# Patient Record
Sex: Male | Born: 1995 | Race: White | Hispanic: No | Marital: Single | Smoking: Never smoker
Health system: Southern US, Community
[De-identification: ages and names within clinical notes are randomized; demographics above are authoritative.]

## PROBLEM LIST (undated history)

## (undated) HISTORY — PX: KNEE SURGERY: SHX244

## (undated) HISTORY — PX: TONSILLECTOMY: SUR1361

---
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
## 2017-09-17 ENCOUNTER — Ambulatory Visit (INDEPENDENT_AMBULATORY_CARE_PROVIDER_SITE_OTHER): Payer: BC Managed Care – PPO

## 2017-09-17 ENCOUNTER — Encounter: Payer: Self-pay | Admitting: Emergency Medicine

## 2017-09-17 ENCOUNTER — Other Ambulatory Visit: Payer: Self-pay

## 2017-09-17 ENCOUNTER — Ambulatory Visit
Admission: EM | Admit: 2017-09-17 | Discharge: 2017-09-17 | Disposition: A | Discharge status: Home / Self Care | Payer: BC Managed Care – PPO | Attending: Family Medicine | Admitting: Family Medicine

## 2017-09-17 DIAGNOSIS — W19XXXA Unspecified fall, initial encounter: Secondary | ICD-10-CM

## 2017-09-17 DIAGNOSIS — W16022A Fall into swimming pool striking bottom causing other injury, initial encounter: Secondary | ICD-10-CM | POA: Diagnosis not present

## 2017-09-17 DIAGNOSIS — M546 Pain in thoracic spine: Secondary | ICD-10-CM | POA: Diagnosis not present

## 2017-09-17 DIAGNOSIS — S060X0A Concussion without loss of consciousness, initial encounter: Secondary | ICD-10-CM

## 2017-09-17 DIAGNOSIS — M542 Cervicalgia: Secondary | ICD-10-CM

## 2017-09-17 MED ORDER — CYCLOBENZAPRINE HCL 10 MG PO TABS: 10.0000 mg | ORAL_TABLET | Freq: Every day | ORAL | 0 refills | Status: AC

## 2017-09-17 NOTE — ED Triage Notes (Signed)
Patient states that he fell in the pool and hit his head on the bottom of the pool on Saturday.  Patient c/o pain in the back of his head, neck and upper back.

## 2017-09-17 NOTE — ED Provider Notes (Signed)
[facility]    CSN: [id number] Arrival date & time: 09/17/17  1408     History   Chief Complaint Chief Complaint  Patient presents with  . Fall  . Head Injury    HPI Brandon Kerr is a 22 y.o. male.   22 yo male with a c/o neck and upper back pain for 2 days and intermittent headaches. States 3 days ago he hit his head on the bottom of a pool. Denies loss of consciousness but felt a little "out of it" afterwards. Denies any vision changes, numbness/tingling, one-sided weakness. States now main complaint is pain to neck and upper back.   The history is provided by the patient.  Fall   Head Injury    History reviewed. No pertinent past medical history.  There are no active problems to display for this patient.   Past Surgical History:  Procedure Laterality Date  . KNEE SURGERY Right   . TONSILLECTOMY         Home Medications    Prior to Admission medications   Medication Sig Start Date End Date Taking? Authorizing Provider  cyclobenzaprine (FLEXERIL) 10 MG tablet Take 1 tablet (10 mg total) by mouth at bedtime. 09/17/17   Payton Mccallum, MD    Family History History reviewed. No pertinent family history.  Social History Social History   Tobacco Use  . Smoking status: Never Smoker  . Smokeless tobacco: Current User    Types: Chew  Substance Use Topics  . Alcohol use: Yes  . Drug use: Never     Allergies   Patient has no known allergies.   Review of Systems Review of Systems   Physical Exam Triage Vital Signs ED Triage Vitals  Enc Vitals Group     BP 09/17/17 1439 119/75     Pulse Rate 09/17/17 1439 (!) 103     Resp 09/17/17 1439 16     Temp 09/17/17 1439 98.4 F (36.9 C)     Temp Source 09/17/17 1439 Oral     SpO2 09/17/17 1439 100 %     Weight 09/17/17 1435 230 lb (104.3 kg)     Height 09/17/17 1435 6' (1.829 m)     Head Circumference --      Peak Flow --      Pain Score 09/17/17 1435 6     Pain Loc --      Pain Edu?  --      Excl. in GC? --    No data found.  Updated Vital Signs BP 119/75 (BP Location: Left Arm)   Pulse (!) 103   Temp 98.4 F (36.9 C) (Oral)   Resp 16   Ht 6' (1.829 m)   Wt 230 lb (104.3 kg)   SpO2 100%   BMI 31.19 kg/m   Visual Acuity Right Eye Distance:   Left Eye Distance:   Bilateral Distance:    Right Eye Near:   Left Eye Near:    Bilateral Near:     Physical Exam  Constitutional: He is oriented to person, place, and time. He appears well-developed and well-nourished. No distress.  HENT:  Head: Normocephalic and atraumatic.  Right Ear: Tympanic membrane, external ear and ear canal normal.  Left Ear: Tympanic membrane, external ear and ear canal normal.  Nose: Nose normal.  Mouth/Throat: Uvula is midline, oropharynx is clear and moist and mucous membranes are normal. No oropharyngeal exudate or tonsillar abscesses.  Eyes: Pupils are equal, round, and reactive to  light. Conjunctivae and EOM are normal. Right eye exhibits no discharge. Left eye exhibits no discharge. No scleral icterus.  Neck: Normal range of motion. Neck supple. No tracheal deviation present. No thyromegaly present.  Cardiovascular: Normal rate, regular rhythm and normal heart sounds.  Pulmonary/Chest: Effort normal and breath sounds normal. No stridor. No respiratory distress. He has no wheezes. He has no rales. He exhibits no tenderness.  Musculoskeletal:       Cervical back: He exhibits decreased range of motion, tenderness and spasm. He exhibits no bony tenderness, no swelling, no edema, no deformity, no laceration and normal pulse.       Thoracic back: He exhibits tenderness, bony tenderness and spasm. He exhibits no swelling, no edema, no deformity, no laceration and normal pulse.  Lymphadenopathy:    He has no cervical adenopathy.  Neurological: He is alert and oriented to person, place, and time. He displays normal reflexes. No cranial nerve deficit or sensory deficit. He exhibits normal  muscle tone. Coordination normal.  nonfocal neurologic exam  Skin: Skin is warm and dry. No rash noted. He is not diaphoretic.  Psychiatric: He has a normal mood and affect. His behavior is normal. Judgment and thought content normal.  Nursing note and vitals reviewed.    UC Treatments / Results  Labs (all labs ordered are listed, but only abnormal results are displayed) Labs Reviewed - No data to display  EKG None  Radiology Dg Cervical Spine Complete  Result Date: 09/17/2017 CLINICAL DATA:  Neck pain after fall. EXAM: CERVICAL SPINE - COMPLETE 4+ VIEW COMPARISON:  None. FINDINGS: There is no evidence of cervical spine fracture or prevertebral soft tissue swelling. Alignment is normal. No other significant bone abnormalities are identified. IMPRESSION: Negative cervical spine radiographs. Electronically Signed   By: Lupita RaiderJames  Green Jr, M.D.   On: 09/17/2017 15:55   Dg Thoracic Spine 2 View  Result Date: 09/17/2017 CLINICAL DATA:  Thoracic spine pain since a fall off a deck 09/15/2017. Initial encounter. EXAM: THORACIC SPINE 2 VIEWS COMPARISON:  None. FINDINGS: There is no evidence of thoracic spine fracture. Alignment is normal. No other significant bone abnormalities are identified. IMPRESSION: Negative exam. Electronically Signed   By: Drusilla Kannerhomas  Dalessio M.D.   On: 09/17/2017 15:53    Procedures Procedures (including critical care time)  Medications Ordered in UC Medications - No data to display  Initial Impression / Assessment and Plan / UC Course  I have reviewed the triage vital signs and the nursing notes.  Pertinent labs & imaging results that were available during my care of the patient were reviewed by me and considered in my medical decision making (see chart for details).      Final Clinical Impressions(s) / UC Diagnoses   Final diagnoses:  Fall, initial encounter  Concussion without loss of consciousness, initial encounter  Neck pain  Acute thoracic back pain,  unspecified back pain laterality    ED Prescriptions    Medication Sig Dispense Auth. Provider   cyclobenzaprine (FLEXERIL) 10 MG tablet Take 1 tablet (10 mg total) by mouth at bedtime. 30 tablet Payton Mccallumonty, Samiha Denapoli, MD     1. x-ray results and diagnosis reviewed with patient 2. rx as per orders above; reviewed possible side effects, interactions, risks and benefits  3. Recommend supportive treatment with otc analgesics prn  4. Follow-up prn if symptoms worsen or don't improve  Controlled Substance Prescriptions [place] Controlled Substance Registry consulted? Not Applicable   Payton Mccallumonty, Sameeha Rockefeller, MD 09/17/17 [phone]

## 2017-09-22 ENCOUNTER — Ambulatory Visit
Admission: EM | Admit: 2017-09-22 | Discharge: 2017-09-22 | Disposition: A | Discharge status: Home / Self Care | Payer: BC Managed Care – PPO | Attending: Family Medicine | Admitting: Family Medicine

## 2017-09-22 DIAGNOSIS — S30861A Insect bite (nonvenomous) of abdominal wall, initial encounter: Secondary | ICD-10-CM

## 2017-09-22 DIAGNOSIS — R5383 Other fatigue: Secondary | ICD-10-CM

## 2017-09-22 DIAGNOSIS — R52 Pain, unspecified: Secondary | ICD-10-CM

## 2017-09-22 DIAGNOSIS — W57XXXA Bitten or stung by nonvenomous insect and other nonvenomous arthropods, initial encounter: Secondary | ICD-10-CM

## 2017-09-22 DIAGNOSIS — M791 Myalgia, unspecified site: Secondary | ICD-10-CM

## 2017-09-22 LAB — BASIC METABOLIC PANEL
ANION GAP: 9 (ref 5–15)
BUN: 9 mg/dL (ref 6–20)
CALCIUM: 8.8 mg/dL — AB (ref 8.9–10.3)
CO2: 25 mmol/L (ref 22–32)
Chloride: 103 mmol/L (ref 98–111)
Creatinine, Ser: 1.02 mg/dL (ref 0.61–1.24)
Glucose, Bld: 86 mg/dL (ref 70–99)
POTASSIUM: 4 mmol/L (ref 3.5–5.1)
SODIUM: 137 mmol/L (ref 135–145)

## 2017-09-22 MED ORDER — DOXYCYCLINE HYCLATE 100 MG PO CAPS: 100.0000 mg | ORAL_CAPSULE | Freq: Two times a day (BID) | ORAL | 0 refills | Status: AC

## 2017-09-22 NOTE — Discharge Instructions (Addendum)
Take medication as prescribed. Rest. Drink plenty of fluids.  ° °Follow up with your primary care physician this week. Return to Urgent care for new or worsening concerns.  ° °

## 2017-09-22 NOTE — ED Provider Notes (Signed)
[facility] ____________________________________________  Time seen: Approximately 11:04 AM  I have reviewed the triage vital signs and the nursing notes.   HISTORY  Chief Complaint Tick Removal   HPI Brandon Kerr is a 22 y.o. male presenting with family at bedside for evaluation of overall not feeling well for approximately 2 weeks.  Patient states 2 weeks ago he had a tick attached to his left side that he removed and did not think much about.  Unsure of how long the tick was attached.  States no rash or skin changes from tick bite site.  States however after tick bite he noticed generalized aching pain, feeling more fatigued.  States 2 days this week he had some diarrhea, none today.  No vomiting or associated abdominal pain.  States normal color stool.  Reports has continued to stay active and is continue to work, and reports that he does work outside, but states that it has been normal without change.  Reports continues to eat and drink well.  No recent cough, congestion or sore throat.  Denies other aggravating or alleviating factors.  Denies any other triggers.  States reports otherwise feels well. Denies chest pain, shortness of breath, abdominal pain, paresthesias, or rash. Denies recent sickness. Denies recent antibiotic use.  Denies chronic medical problems.  Care, [facility]: PCP   No past medical history on file.  There are no active problems to display for this patient.   Past Surgical History:  Procedure Laterality Date  . KNEE SURGERY Right   . TONSILLECTOMY       No current facility-administered medications for this encounter.   Current Outpatient Medications:  .  cyclobenzaprine (FLEXERIL) 10 MG tablet, Take 1 tablet (10 mg total) by mouth at bedtime., Disp: 30 tablet, Rfl: 0 .  doxycycline (VIBRAMYCIN) 100 MG capsule, Take 1 capsule (100 mg total) by mouth 2 (two) times daily., Disp: 20 capsule, Rfl: 0  Allergies Patient has no known  allergies.  No family history on file.  Social History Social History   Tobacco Use  . Smoking status: Never Smoker  . Smokeless tobacco: Current User    Types: Chew  Substance Use Topics  . Alcohol use: Yes  . Drug use: Never    Review of Systems Constitutional: No fever Cardiovascular: Denies chest pain. Respiratory: Denies shortness of breath. Gastrointestinal: No abdominal pain.  No nausea, no vomiting. As above.  Genitourinary: Negative for dysuria. Musculoskeletal: Negative for back pain. Skin: Negative for rash.   ____________________________________________   PHYSICAL EXAM:  VITAL SIGNS: ED Triage Vitals  Enc Vitals Group     BP 09/22/17 1003 122/74     Pulse Rate 09/22/17 1003 80     Resp 09/22/17 1003 16     Temp 09/22/17 1003 98.2 F (36.8 C)     Temp Source 09/22/17 1003 Oral     SpO2 09/22/17 1003 100 %     Weight 09/22/17 1001 238 lb (108 kg)     Height 09/22/17 1001 6' (1.829 m)     Head Circumference --      Peak Flow --      Pain Score 09/22/17 1000 5     Pain Loc --      Pain Edu? --      Excl. in GC? --     Constitutional: Alert and oriented. Well appearing and in no acute distress. Eyes: Conjunctivae are normal.  ENT      Head: Normocephalic and atraumatic.  Nose: No congestion/rhinnorhea.      Mouth/Throat: Mucous membranes are moist. Oropharynx non-erythematous. Neck: No stridor. Supple without meningismus.  Hematological/Lymphatic/Immunilogical: No cervical lymphadenopathy. Cardiovascular: Normal rate, regular rhythm. Grossly normal heart sounds.  Good peripheral circulation. Respiratory: Normal respiratory effort without tachypnea nor retractions. Breath sounds are clear and equal bilaterally. No wheezes, rales, rhonchi. Gastrointestinal: Soft and nontender. No CVA tenderness. Musculoskeletal:  No midline cervical, thoracic or lumbar tenderness to palpation.  Neurologic:  Normal speech and language. No gross focal neurologic  deficits are appreciated. Speech is normal. No gait instability.  Skin:  Skin is warm, dry and intact. No rash noted. Psychiatric: Mood and affect are normal. Speech and behavior are normal. Patient exhibits appropriate insight and judgment   ___________________________________________   LABS (all labs ordered are listed, but only abnormal results are displayed)  Labs Reviewed  BASIC METABOLIC PANEL - Abnormal; Notable for the following components:      Result Value   Calcium 8.8 (*)    All other components within normal limits  B. BURGDORFI ANTIBODIES  ROCKY MTN SPOTTED FVR ABS PNL(IGG+IGM)    RADIOLOGY  No results found. ____________________________________________  PROCEDURES Procedures   INITIAL IMPRESSION / ASSESSMENT AND PLAN / ED COURSE  Pertinent labs & imaging results that were available during my care of the patient were reviewed by me and considered in my medical decision making (see chart for details).  Very well-appearing patient.  No acute distress.  Will evaluate BMP as well as send out for tick labs as requested.  PMP reviewed, overall unremarkable discussed with patient mother.  Will send for Lyme and RMSF labs, and will empirically treat with oral doxy.  Discussed strict follow-up and return parameters and recommend for reevaluation and follow-up with primary care in 1 week.Discussed indication, risks and benefits of medications with patient, including photosensitivity.  Discussed follow up with Primary care physician this week. Discussed follow up and return parameters including no resolution or any worsening concerns. Patient verbalized understanding and agreed to plan.   ____________________________________________   FINAL CLINICAL IMPRESSION(S) / ED DIAGNOSES  Final diagnoses:  Tick bite, initial encounter  Other fatigue  Generalized body aches     ED Discharge Orders        Ordered    doxycycline (VIBRAMYCIN) 100 MG capsule  2 times daily      09/22/17 1137       Note: This dictation was prepared with Dragon dictation along with smaller phrase technology. Any transcriptional errors that result from this process are unintentional.         Renford DillsMiller, Kaleia Longhi, NP 09/22/17 1253

## 2017-09-22 NOTE — ED Triage Notes (Addendum)
As per patient had Tick bite 3 weeks ago had some neck, back and shoulder pain seen here and Rx cyclobenzapril but now remember had tick bite 3 weeks ago and had some fever 99.5-100.5 F in past, has some exhaustion. As per patient Has some HA onset 7 days and some diarrhea.

## 2017-09-25 LAB — B. BURGDORFI ANTIBODIES

## 2017-09-26 LAB — ROCKY MTN SPOTTED FVR ABS PNL(IGG+IGM)
RMSF IGG: POSITIVE — AB
RMSF IGM: 0.24 {index} (ref 0.00–0.89)

## 2017-09-26 LAB — RMSF, IGG, IFA: RMSF, IGG, IFA: 1:64 {titer} — ABNORMAL HIGH

## 2017-10-01 ENCOUNTER — Telehealth (HOSPITAL_COMMUNITY): Payer: Self-pay

## 2017-10-01 NOTE — Telephone Encounter (Signed)
Pt called and given test results and instructed of Renford DillsLindsey Miller NP instructions, verbalized understanding.

## 2019-03-24 ENCOUNTER — Ambulatory Visit: Payer: BC Managed Care – PPO | Attending: Internal Medicine

## 2019-03-27 IMAGING — CR DG THORACIC SPINE 2V
2 series · 3 of 3 positions shown · non-contrast
Comparison: None.

CLINICAL DATA: Thoracic spine pain since a fall off a deck
09/15/2017. Initial encounter.

EXAM:
THORACIC SPINE 2 VIEWS

[t-spine ap]
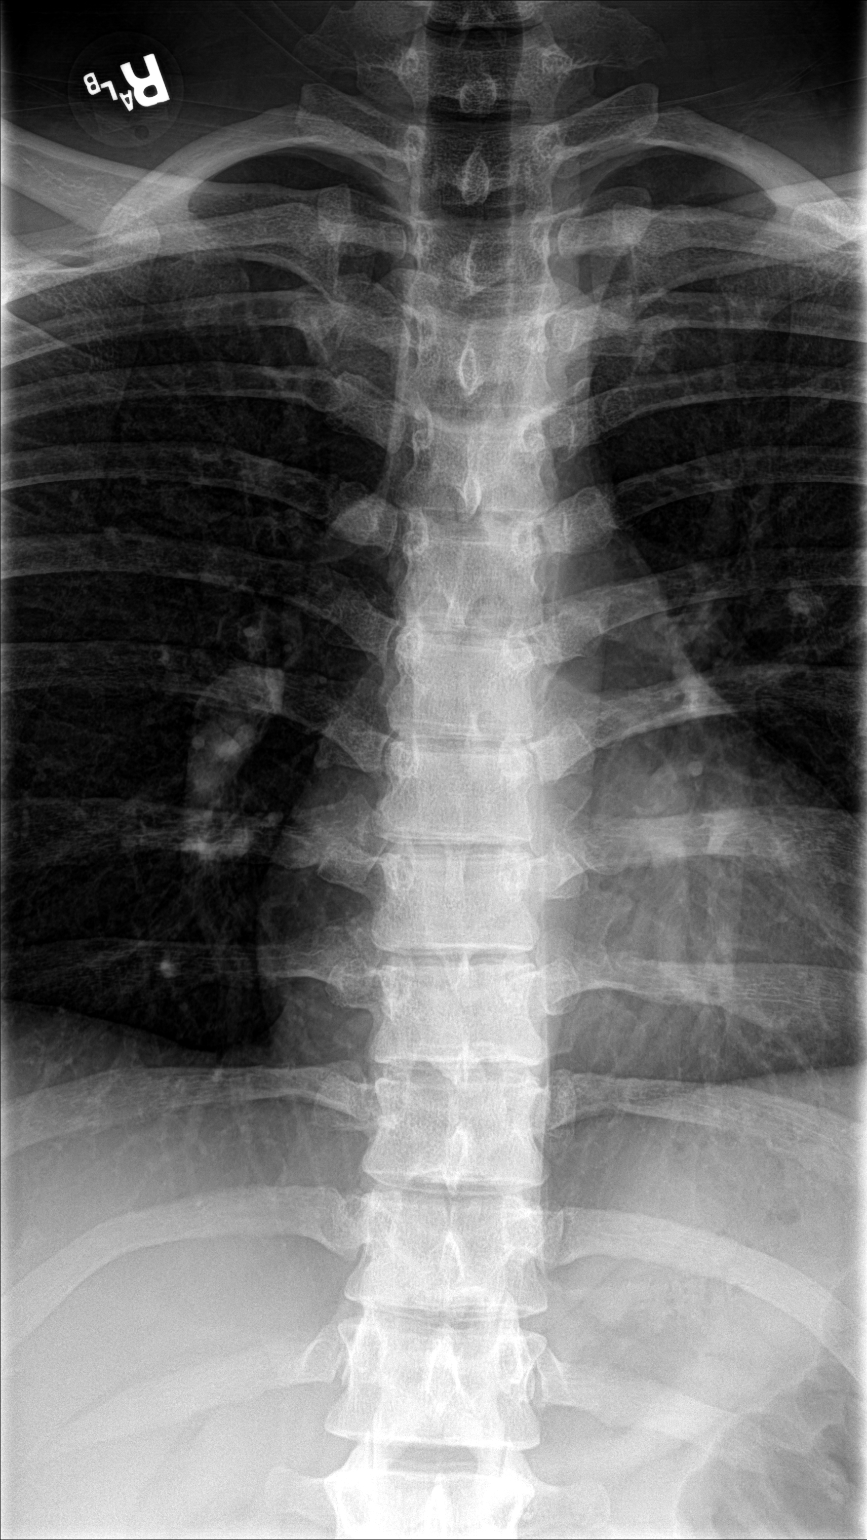

[Series 2: t-spine lat · 0.14mm/px · 2 of 2 slices shown]
[im 1/2]
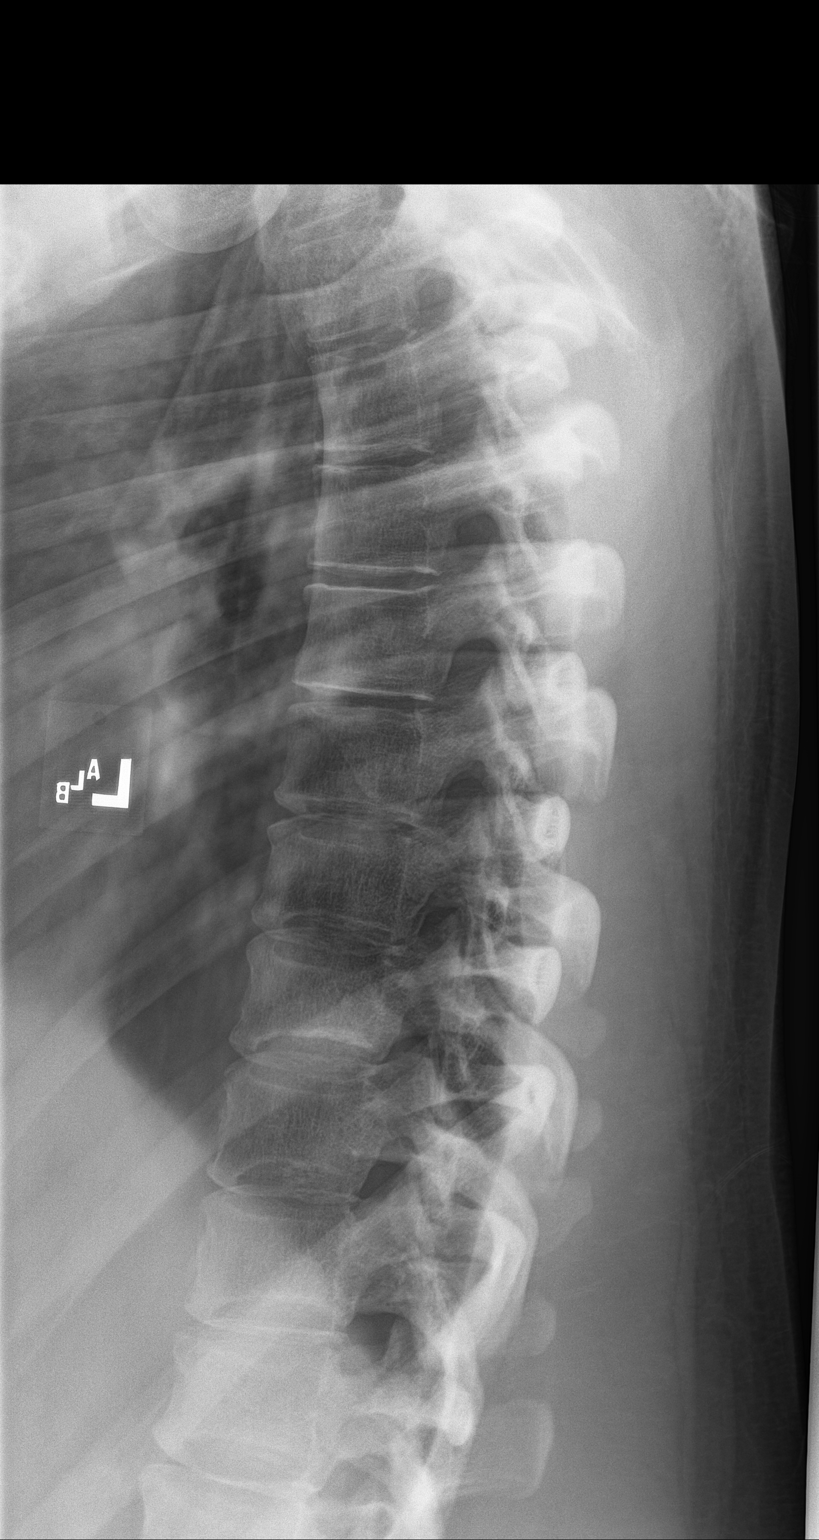
[im 2/2]
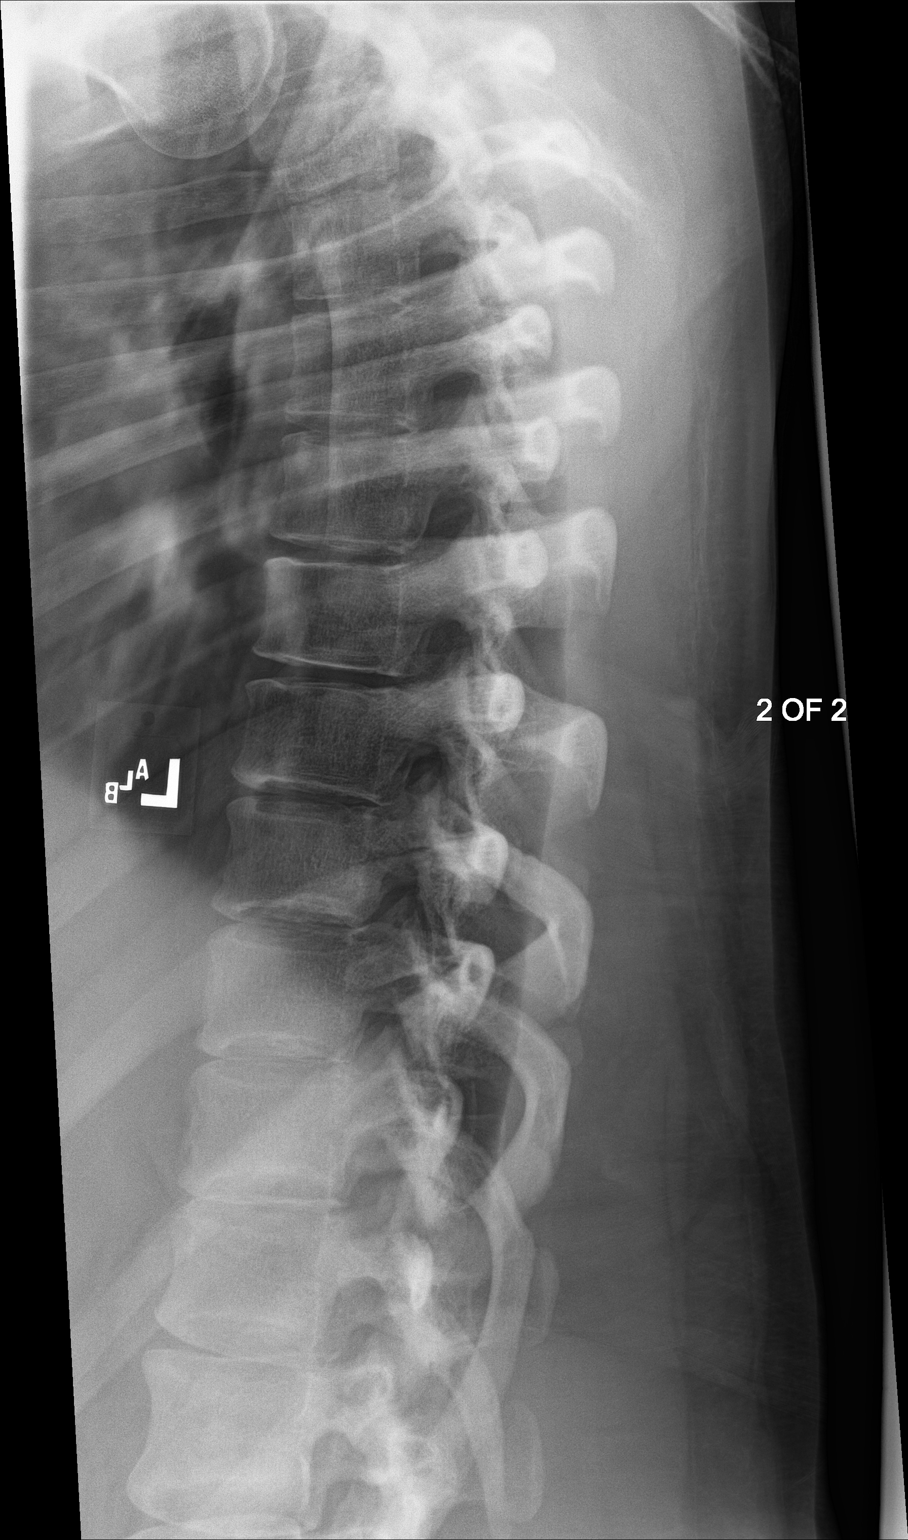

[3 of 3 positions shown; findings below may reference images not displayed]

FINDINGS: There is no evidence of thoracic spine fracture. Alignment is
normal. No other significant bone abnormalities are identified.
IMPRESSION: Negative exam.

## 2019-11-26 ENCOUNTER — Other Ambulatory Visit: Payer: Self-pay

## 2019-11-26 ENCOUNTER — Other Ambulatory Visit: Payer: BC Managed Care – PPO

## 2019-11-26 DIAGNOSIS — Z20822 Contact with and (suspected) exposure to covid-19: Secondary | ICD-10-CM

## 2019-11-28 LAB — NOVEL CORONAVIRUS, NAA: SARS-CoV-2, NAA: NOT DETECTED

## 2019-11-28 LAB — SARS-COV-2, NAA 2 DAY TAT

## 2019-12-01 ENCOUNTER — Other Ambulatory Visit: Payer: BC Managed Care – PPO

## 2019-12-01 ENCOUNTER — Other Ambulatory Visit: Payer: Self-pay

## 2019-12-01 DIAGNOSIS — Z20822 Contact with and (suspected) exposure to covid-19: Secondary | ICD-10-CM

## 2019-12-02 LAB — SARS-COV-2, NAA 2 DAY TAT

## 2019-12-02 LAB — NOVEL CORONAVIRUS, NAA: SARS-CoV-2, NAA: NOT DETECTED

## 2021-02-03 ENCOUNTER — Other Ambulatory Visit: Payer: Self-pay | Admitting: Podiatry

## 2021-02-03 ENCOUNTER — Ambulatory Visit (INDEPENDENT_AMBULATORY_CARE_PROVIDER_SITE_OTHER): Payer: BC Managed Care – PPO

## 2021-02-03 ENCOUNTER — Ambulatory Visit: Payer: BC Managed Care – PPO | Admitting: Podiatry

## 2021-02-03 ENCOUNTER — Other Ambulatory Visit: Payer: Self-pay

## 2021-02-03 DIAGNOSIS — M722 Plantar fascial fibromatosis: Secondary | ICD-10-CM | POA: Diagnosis not present

## 2021-02-04 ENCOUNTER — Encounter: Payer: Self-pay | Admitting: Podiatry

## 2021-02-04 NOTE — Progress Notes (Signed)
  Subjective:  Patient ID: Brandon Kerr, male    DOB: 05/05/1995,  MRN: [id number]  Chief Complaint  Patient presents with   Plantar Fasciitis    Right foot pain in the heel     25 y.o. male presents with the above complaint.  Patient presents with complaint of right heel pain that has been going for 6 to 7 months has progressed to getting worse.  Pain starts in the morning and gets worse.  Feels like his walking needles and the pain.  He has been doing this for quite some time.  He would like to discuss treatment options for this.  He works a lot on his foot.  He does not wear any kind of orthotics.  He wears regular shoes.  He has not seen anyone else prior to seeing me for this.   Review of Systems: Negative except as noted in the HPI. Denies N/V/F/Ch.  No past medical history on file.  Current Outpatient Medications:    cyclobenzaprine (FLEXERIL) 10 MG tablet, Take 1 tablet (10 mg total) by mouth at bedtime., Disp: 30 tablet, Rfl: 0   doxycycline (VIBRAMYCIN) 100 MG capsule, Take 1 capsule (100 mg total) by mouth 2 (two) times daily., Disp: 20 capsule, Rfl: 0  Social History   Tobacco Use  Smoking Status Never  Smokeless Tobacco Current   Types: Chew    No Known Allergies Objective:  There were no vitals filed for this visit. There is no height or weight on file to calculate BMI. Constitutional Well developed. Well nourished.  Vascular Dorsalis pedis pulses palpable bilaterally. Posterior tibial pulses palpable bilaterally. Capillary refill normal to all digits.  No cyanosis or clubbing noted. Pedal hair growth normal.  Neurologic Normal speech. Oriented to person, place, and time. Epicritic sensation to light touch grossly present bilaterally.  Dermatologic Nails well groomed and normal in appearance. No open wounds. No skin lesions.  Orthopedic: Normal joint ROM without pain or crepitus bilaterally. No visible deformities. Tender to palpation at the  calcaneal tuber right. No pain with calcaneal squeeze right. Ankle ROM diminished range of motion right. Silfverskiold Test: positive right.   Radiographs: Taken and reviewed. No acute fractures or dislocations. No evidence of stress fracture.  Plantar heel spur present. Posterior heel spur absent.   Assessment:   1. Plantar fasciitis of right foot    Plan:  Patient was evaluated and treated and all questions answered.  Plantar Fasciitis, right - XR reviewed as above.  - Educated on icing and stretching. Instructions given.  - Injection delivered to the plantar fascia as below. - DME: Plantar Fascial Brace - Pharmacologic management: None  Procedure: Injection Tendon/Ligament Location: Right plantar fascia at the glabrous junction; medial approach. Skin Prep: alcohol Injectate: 0.5 cc 0.5% marcaine plain, 0.5 cc of 1% Lidocaine, 0.5 cc kenalog 10. Disposition: Patient tolerated procedure well. Injection site dressed with a band-aid.  No follow-ups on file.

## 2021-03-08 ENCOUNTER — Ambulatory Visit: Payer: BC Managed Care – PPO | Admitting: Podiatry
